# Patient Record
Sex: Female | Born: 1990 | Race: White | Hispanic: No | Marital: Single | Smoking: Former smoker
Health system: Southern US, Community
[De-identification: ages and names within clinical notes are randomized; demographics above are authoritative.]

## PROBLEM LIST (undated history)

## (undated) DIAGNOSIS — F319 Bipolar disorder, unspecified: Secondary | ICD-10-CM

## (undated) DIAGNOSIS — F4310 Post-traumatic stress disorder, unspecified: Secondary | ICD-10-CM

## (undated) DIAGNOSIS — F1411 Cocaine abuse, in remission: Secondary | ICD-10-CM

## (undated) DIAGNOSIS — F419 Anxiety disorder, unspecified: Secondary | ICD-10-CM

## (undated) DIAGNOSIS — F1990 Other psychoactive substance use, unspecified, uncomplicated: Secondary | ICD-10-CM

## (undated) HISTORY — PX: HERNIA REPAIR: SHX51

## (undated) HISTORY — PX: OVARIAN CYST REMOVAL: SHX89

---

## 2017-04-20 ENCOUNTER — Ambulatory Visit
Admission: EM | Admit: 2017-04-20 | Discharge: 2017-04-20 | Disposition: A | Discharge status: Home / Self Care | Payer: Medicaid Other | Attending: Emergency Medicine | Admitting: Emergency Medicine

## 2017-04-20 ENCOUNTER — Other Ambulatory Visit: Payer: Self-pay

## 2017-04-20 DIAGNOSIS — F329 Major depressive disorder, single episode, unspecified: Secondary | ICD-10-CM | POA: Diagnosis not present

## 2017-04-20 DIAGNOSIS — F32A Depression, unspecified: Secondary | ICD-10-CM

## 2017-04-20 DIAGNOSIS — Z760 Encounter for issue of repeat prescription: Secondary | ICD-10-CM | POA: Diagnosis not present

## 2017-04-20 DIAGNOSIS — F4310 Post-traumatic stress disorder, unspecified: Secondary | ICD-10-CM

## 2017-04-20 DIAGNOSIS — F319 Bipolar disorder, unspecified: Secondary | ICD-10-CM | POA: Diagnosis not present

## 2017-04-20 HISTORY — DX: Post-traumatic stress disorder, unspecified: F43.10

## 2017-04-20 HISTORY — DX: Bipolar disorder, unspecified: F31.9

## 2017-04-20 HISTORY — DX: Anxiety disorder, unspecified: F41.9

## 2017-04-20 MED ORDER — FLUOXETINE HCL 40 MG PO CAPS: 80.0000 mg | ORAL_CAPSULE | Freq: Every day | ORAL | 0 refills | Status: DC

## 2017-04-20 MED ORDER — ALPRAZOLAM 1 MG PO TABS: 1.0000 mg | ORAL_TABLET | Freq: Three times a day (TID) | ORAL | 0 refills | Status: DC | PRN

## 2017-04-20 MED ORDER — LURASIDONE HCL 60 MG PO TABS: 60.0000 mg | ORAL_TABLET | Freq: Every day | ORAL | 0 refills | Status: DC

## 2017-04-20 NOTE — Discharge Instructions (Signed)
Follow[facility] [place] as [place].  Go immediately to the ER for thoughts of hurting yourself other people, if your depression gets worse, for auditory visual hallucinations, or for any concerns.

## 2017-04-20 NOTE — ED Provider Notes (Signed)
HPI  SUBJECTIVE:  Patrizia Cuff is a 27 y.o. female who presents for medication refill.  States that she ran out of her Latuda, Prozac and Xanax about a week and a half ago.  States that she has plenty of metformin and prazosin available.  She has an appointment with [facility] on March 4, and 11 days.  She was getting her prescriptions from [facility] in [place].  She denies suicidal, homicidal ideation, auditory or visual hallucinations.  She states that she has tried Vistaril in the past for her anxiety/PTSD and it has not worked for her.  The Prozac, Latuda and Xanax worked well for her.  She has a past medical history of bipolar 1, PTSD, anxiety, depression.  No history of diabetes.  LMP: Nexplanon.  Denies the possibility being pregnant.  PMD. None currently   Past Medical History:  Diagnosis Date  . Anxiety   . Bipolar 1 disorder (HCC)   . PTSD (post-traumatic stress disorder)     History reviewed. No pertinent surgical history.  History reviewed. No pertinent family history.  Social History   Tobacco Use  . Smoking status: Former Smoker    Types: E-cigarettes  . Smokeless tobacco: Current User  Substance Use Topics  . Alcohol use: No    Frequency: Never  . Drug use: No    No current facility-administered medications for this encounter.   Current Outpatient Medications:  .  metFORMIN (GLUCOPHAGE) 500 MG tablet, Take 500 mg by mouth 2 (two) times daily with a meal., Disp: , Rfl:  .  ALPRAZolam (XANAX) 1 MG tablet, Take 1 tablet (1 mg total) by mouth 3 (three) times daily as needed for anxiety., Disp: 33 tablet, Rfl: 0 .  FLUoxetine (PROZAC) 40 MG capsule, Take 2 capsules (80 mg total) by mouth daily for 12 days., Disp: 24 capsule, Rfl: 0 .  lurasidone 60 MG TABS, Take 1 tablet (60 mg total) by mouth daily with breakfast for 12 days., Disp: 12 tablet, Rfl: 0  Allergies  Allergen Reactions  . Doxycycline Rash     ROS  As noted in HPI.    Physical Exam  BP 126/78 (BP Location: Left Arm)   Pulse (!) 109   Temp 98.2 F (36.8 C) (Oral)   Resp 18   Wt 172 lb (78 kg)   SpO2 100%   Constitutional: Well developed, well nourished, no acute distress Eyes:  EOMI, conjunctiva normal bilaterally HENT: Normocephalic, atraumatic,mucus membranes moist Respiratory: Normal inspiratory effort Cardiovascular: Normal rate GI: nondistended skin: No rash, skin intact Musculoskeletal: no deformities Neurologic: Alert & oriented x 3, no focal neuro deficits Psychiatric: Speech and behavior appropriate.    ED Course   Medications - No data to display  No orders of the defined types were placed in this encounter.   No results found for this or any previous visit (from the past 24 hour(s)). No results found.  ED Clinical Impression  Medication refill  PTSD (post-traumatic stress disorder)  Depression, unspecified depression type  Bipolar affective disorder, remission status unspecified (HCC)   ED Assessment/Plan  [place] Narcotic database reviewed for this patient, and feel that the risk/benefit ratio today is favorable for proceeding with a prescription for controlled substance.  No benzodiazepine prescriptions in [place], [place], [place] for the past 2 years.  Will refill patient's Prozac 80 mg daily, Latuda 60 mg daily and will do 11 days of Xanax 1 mg 3 times a day.  She  will follow-up [facility] as scheduled on March 4.  She will go go to the ER for any suicidal, homicidal ideation, auditory visual or other concerns.  Patient agrees with plan.   Meds ordered this encounter  Medications  . ALPRAZolam (XANAX) 1 MG tablet    Sig: Take 1 tablet (1 mg total) by mouth 3 (three) times daily as needed for anxiety.    Dispense:  33 tablet    Refill:  0  . FLUoxetine (PROZAC) 40 MG capsule    Sig: Take 2 capsules (80 mg total) by mouth daily for 12 days.     Dispense:  24 capsule    Refill:  0  . lurasidone 60 MG TABS    Sig: Take 1 tablet (60 mg total) by mouth daily with breakfast for 12 days.    Dispense:  12 tablet    Refill:  0    *This clinic note was created using Scientist, clinical (histocompatibility and immunogenetics)Dragon dictation software. Therefore, there may be occasional mistakes despite careful proofreading.   ?   Domenick GongMortenson, Katrianna Friesenhahn, MD 04/20/17 [phone]

## 2017-04-20 NOTE — ED Triage Notes (Signed)
Pt is out of her medications to manage, PTSD, Bipolar, and anxiety. She has an appt however it is March 3. She has been out for a week and a half.

## 2017-07-25 ENCOUNTER — Ambulatory Visit
Admission: EM | Admit: 2017-07-25 | Discharge: 2017-07-25 | Disposition: A | Discharge status: Home / Self Care | Payer: Medicaid Other | Attending: Family Medicine | Admitting: Family Medicine

## 2017-07-25 ENCOUNTER — Encounter: Payer: Self-pay | Admitting: Emergency Medicine

## 2017-07-25 ENCOUNTER — Other Ambulatory Visit: Payer: Self-pay

## 2017-07-25 DIAGNOSIS — K0889 Other specified disorders of teeth and supporting structures: Secondary | ICD-10-CM

## 2017-07-25 MED ORDER — AMOXICILLIN-POT CLAVULANATE 875-125 MG PO TABS: 1.0000 | ORAL_TABLET | Freq: Two times a day (BID) | ORAL | 0 refills | Status: DC

## 2017-07-25 MED ORDER — IBUPROFEN 800 MG PO TABS: 800.0000 mg | ORAL_TABLET | Freq: Three times a day (TID) | ORAL | 0 refills | Status: DC | PRN

## 2017-07-25 MED ORDER — LIDOCAINE VISCOUS HCL 2 % MT SOLN: 5.0000 mL | Freq: Four times a day (QID) | OROMUCOSAL | 0 refills | Status: DC | PRN

## 2017-07-25 MED ORDER — LIDOCAINE VISCOUS HCL 2 % MT SOLN: 15.0000 mL | Freq: Once | OROMUCOSAL | Status: DC

## 2017-07-25 MED ORDER — LIDOCAINE VISCOUS HCL 2 % MT SOLN
5.0000 mL | Freq: Once | OROMUCOSAL | Status: AC
  Administered 2017-07-25: 5 mL via OROMUCOSAL

## 2017-07-25 NOTE — ED Provider Notes (Signed)
[facility] ____________________________________________  Time seen: Approximately 7:31 PM  I have reviewed the triage vital signs and the nursing notes.   HISTORY  Chief Complaint Dental Pain  HPI Virginia Lawson is a 27 y.o. female presenting for evaluation of right lower dental pain.  She has had issues with this tooth for at least several weeks, but reports it is worsened over the last 2 to 3 days.  Patient states that she was referred by her dentist to patient states that dental surgeon that could extract with anesthesia.  States that her dentist that it needs to be extracted.  States that she knows she has a chip to the back of the tooth is been there.  Denies any acute injury.  Denies recent cough, congestion, sore throat, fevers.  Has continued to overall eat and drink well.  States pain has been continuously worsening and disrupting daily activities.  States tried some topical numbing medicine over-the-counter which did not help.  No other alleviating measures attempted.  Denies other aggravating factors.  Reports otherwise feels well.  No LMP recorded (lmp unknown). Patient has had an implant.  Denies pregnancy.  Past Medical History:  Diagnosis Date  . Anxiety   . Bipolar 1 disorder (HCC)   . PTSD (post-traumatic stress disorder)     There are no active problems to display for this patient.   Past Surgical History:  Procedure Laterality Date  . OVARIAN CYST REMOVAL      Current Outpatient Rx  . Order #: [id number] Class: Historical Med  . Order #: [id number] Class: Historical Med  . Order #: [id number] Class: Historical Med  . Order #: [id number] Class: Historical Med  . Order #: [id number] Class: Historical Med  . Order #: [id number] Class: Normal  . Order #: [id number] Class: Normal  . Order #: [id number] Class: Normal    Allergies Doxycycline and Tramadol  History reviewed. No pertinent family history.  Social History Social History   Tobacco Use  . Smoking  status: Current Every Day Smoker    Packs/day: 1.00    Types: Cigarettes  . Smokeless tobacco: Never Used  Substance Use Topics  . Alcohol use: No    Frequency: Never  . Drug use: No    Review of Systems Constitutional: No fever/chills. Reports continues to eat and drink foods and fluids well.  ENT: No sore throat.  As above Cardiovascular: Denies chest pain. Respiratory: Denies shortness of breath. Gastrointestinal: No abdominal pain.  No nausea, no vomiting.  Skin: Negative for rash. ____________________________________________   PHYSICAL EXAM:  VITAL SIGNS: ED Triage Vitals  Enc Vitals Group     BP 07/25/17 1847 113/77     Pulse Rate 07/25/17 1847 77     Resp 07/25/17 1847 18     Temp 07/25/17 1847 98.5 F (36.9 C)     Temp Source 07/25/17 1847 Oral     SpO2 07/25/17 1847 98 %     Weight 07/25/17 1839 170 lb (77.1 kg)     Height 07/25/17 1839  (1.676 m)     Head Circumference --      Peak Flow --      Pain Score 07/25/17 1839 9     Pain Loc --      Pain Edu? --      Excl. in GC? --     Constitutional: Alert and oriented. Well appearing and in no acute distress. Eyes: Conjunctivae are normal.  Head: Atraumatic.  No facial swelling noted. Ears: Bilateral ears no erythema, normal  TMs.  Nose: No congestion/rhinnorhea. Mouth/Throat: Mucous membranes are moist.  Oropharynx non-erythematous.  No tonsillar swelling exudate. Periodontal Exam     Gumline tenderness, erythema and edema present as above, no palpated abscess, no fluctuance, moderate tenderness to direct palpation of tooth 29 as well as surrounding gum. Neck: No stridor.  Hematological/Lymphatic/Immunilogical: No cervical lymphadenopathy. Cardiovascular:   Normal rate, regular rhythm. Grossly normal heart sounds. Good peripheral circulation. Respiratory: Normal respiratory effort.  No retractions.  No wheezes, rales or rhonchi. Musculoskeletal: Steady gait. Neurologic:  Normal speech and language.  No gross focal neurologic deficits are appreciated. Speech is normal. No gait instability. Skin:  Skin is warm, dry and intact. No rash noted. Psychiatric: Mood and affect are normal. Speech and behavior are normal.  ____________________________________________   LABS (all labs ordered are listed, but only abnormal results are displayed)  Labs Reviewed - No data to display   PROCEDURES  INITIAL IMPRESSION / ASSESSMENT AND PLAN / ED COURSE  Pertinent labs & imaging results that were available during my care of the patient were reviewed by me and considered in my medical decision making (see chart for details). Well-appearing patient.  No acute distress.  Presenting for evaluation of dental pain as above.  Suspect acute infection, no dental abscess visualized or palpated.  Will empirically start on oral Augmentin.  Start on 800 mg ibuprofen and PRN viscous lidocaine.  Encourage follow-up with dentist as soon as possible.  Discussed her follow-up and return parameters.   Instructed to return to the Urgent Care or ER  for symptoms that change or worsen or if unable to schedule an appointment. ____________________________________________   FINAL CLINICAL IMPRESSION(S) / ED DIAGNOSES  Final diagnoses:  Pain, dental         Renford Dills, NP 07/25/17 1938

## 2017-07-25 NOTE — Discharge Instructions (Signed)
Take medication as prescribed. Rest. Drink plenty of fluids.   Follow up with Dentist as soon as possible.   Follow up with your primary care physician this week as needed. Return to Urgent care for new or worsening concerns.

## 2017-07-25 NOTE — ED Triage Notes (Signed)
Patient c/o right lower tooth pain that started 5 days ago. Patient stated she has an exposed nerve that is causing the pain. States her right ear and head are hurting from the tooth pain.

## 2018-01-20 ENCOUNTER — Ambulatory Visit
Admission: EM | Admit: 2018-01-20 | Discharge: 2018-01-20 | Disposition: A | Discharge status: Home / Self Care | Payer: Medicaid Other | Attending: Family Medicine | Admitting: Family Medicine

## 2018-01-20 ENCOUNTER — Other Ambulatory Visit: Payer: Self-pay

## 2018-01-20 ENCOUNTER — Encounter: Payer: Self-pay | Admitting: Emergency Medicine

## 2018-01-20 DIAGNOSIS — K047 Periapical abscess without sinus: Secondary | ICD-10-CM

## 2018-01-20 MED ORDER — HYDROCODONE-ACETAMINOPHEN 5-325 MG PO TABS: ORAL_TABLET | ORAL | 0 refills | Status: DC

## 2018-01-20 MED ORDER — PENICILLIN V POTASSIUM 500 MG PO TABS: 500.0000 mg | ORAL_TABLET | Freq: Three times a day (TID) | ORAL | 0 refills | Status: DC

## 2018-01-20 NOTE — ED Triage Notes (Signed)
Patient c/o pain and swelling in the bottom left tooth that started 6 weeks ago.  Patient reports some pus.

## 2018-01-20 NOTE — ED Provider Notes (Signed)
[facility]    CSN: [id number] Arrival date & time: 01/20/18  1615     History   Chief Complaint Chief Complaint  Patient presents with  . Dental Pain    HPI Virginia Lawson is a 27 y.o. female.   The history is provided by the patient.  Dental Pain  Location:  Lower Lower teeth location:  19/LL 1st molar Quality:  Aching Severity:  Moderate Onset quality:  Gradual Duration:  6 weeks Timing:  Constant Progression:  Worsening Chronicity:  New Context: dental caries and poor dentition   Relieved by:  Nothing Ineffective treatments:  NSAIDs, acetaminophen and topical anesthetic gel Associated symptoms: gum swelling   Associated symptoms: no congestion, no difficulty swallowing, no drooling, no facial pain, no facial swelling, no fever, no headaches, no neck pain, no neck swelling, no oral bleeding, no oral lesions and no trismus   Risk factors: lack of dental care, periodontal disease and smoking     Past Medical History:  Diagnosis Date  . Anxiety   . Bipolar 1 disorder (HCC)   . PTSD (post-traumatic stress disorder)     There are no active problems to display for this patient.   Past Surgical History:  Procedure Laterality Date  . HERNIA REPAIR    . OVARIAN CYST REMOVAL      OB History   None      Home Medications    Prior to Admission medications   Medication Sig Start Date End Date Taking? Authorizing Provider  atorvastatin (LIPITOR) 20 MG tablet Take 20 mg by mouth daily.   Yes [provider]  desvenlafaxine (PRISTIQ) 50 MG 24 hr tablet Take 50 mg by mouth daily.   Yes [provider]  gabapentin (NEURONTIN) 300 MG capsule Take 600 mg by mouth 3 (three) times daily.   Yes [provider]  metFORMIN (GLUCOPHAGE) 500 MG tablet Take 500 mg by mouth 2 (two) times daily with a meal.   Yes [provider]  prazosin (MINIPRESS) 1 MG capsule  07/08/17  Yes [provider]  amoxicillin-clavulanate  (AUGMENTIN) 875-125 MG tablet Take 1 tablet by mouth every 12 (twelve) hours. 07/25/17   Renford DillsMiller, Lindsey, NP  HYDROcodone-acetaminophen (NORCO/VICODIN) 5-325 MG tablet 1-2 tabs po qd prn 01/20/18   Payton Mccallumonty, Melik Blancett, MD  ibuprofen (ADVIL,MOTRIN) 800 MG tablet Take 1 tablet (800 mg total) by mouth every 8 (eight) hours as needed for moderate pain. 07/25/17   Renford DillsMiller, Lindsey, NP  lidocaine (XYLOCAINE) 2 % solution Use as directed 5 mLs in the mouth or throat every 6 (six) hours as needed for mouth pain (hold to area x 3 mins then spit). 07/25/17   Renford DillsMiller, Lindsey, NP  penicillin v potassium (VEETID) 500 MG tablet Take 1 tablet (500 mg total) by mouth 3 (three) times daily. 01/20/18   Payton Mccallumonty, Thaniel Coluccio, MD    Family History Family History  Family history unknown: Yes    Social History Social History   Tobacco Use  . Smoking status: Current Every Day Smoker    Packs/day: 1.00    Types: Cigarettes  . Smokeless tobacco: Never Used  Substance Use Topics  . Alcohol use: No    Frequency: Never  . Drug use: No     Allergies   Doxycycline and Tramadol   Review of Systems Review of Systems  Constitutional: Negative for fever.  HENT: Negative for congestion, drooling, facial swelling and mouth sores.   Musculoskeletal: Negative for neck pain.  Neurological: Negative for  headaches.     Physical Exam Triage Vital Signs ED Triage Vitals  Enc Vitals Group     BP 01/20/18 1631 120/82     Pulse Rate 01/20/18 1631 95     Resp 01/20/18 1631 16     Temp 01/20/18 1631 98.5 F (36.9 C)     Temp Source 01/20/18 1631 Oral     SpO2 01/20/18 1631 97 %     Weight 01/20/18 1628 206 lb (93.4 kg)     Height 01/20/18 1628 5\' 5"  (1.651 m)     Head Circumference --      Peak Flow --      Pain Score 01/20/18 1628 7     Pain Loc --      Pain Edu? --      Excl. in GC? --    No data found.  Updated Vital Signs BP 120/82 (BP Location: Left Arm)   Pulse 95   Temp 98.5 F (36.9 C) (Oral)   Resp 16    Ht 5\' 5"  (1.651 m)   Wt 93.4 kg   SpO2 97%   BMI 34.28 kg/m   Visual Acuity Right Eye Distance:   Left Eye Distance:   Bilateral Distance:    Right Eye Near:   Left Eye Near:    Bilateral Near:     Physical Exam  Constitutional: She appears well-developed and well-nourished. No distress.  HENT:  Mouth/Throat: Uvula is midline, oropharynx is clear and moist and mucous membranes are normal. Abnormal dentition. Dental abscesses present. No uvula swelling. No tonsillar exudate.  Skin: She is not diaphoretic.  Nursing note and vitals reviewed.    UC Treatments / Results  Labs (all labs ordered are listed, but only abnormal results are displayed) Labs Reviewed - No data to display  EKG None  Radiology No results found.  Procedures Procedures (including critical care time)  Medications Ordered in UC Medications - No data to display  Initial Impression / Assessment and Plan / UC Course  I have reviewed the triage vital signs and the nursing notes.  Pertinent labs & imaging results that were available during my care of the patient were reviewed by me and considered in my medical decision making (see chart for details).      Final Clinical Impressions(s) / UC Diagnoses   Final diagnoses:  Dental infection    ED Prescriptions    Medication Sig Dispense Auth. Provider   penicillin v potassium (VEETID) 500 MG tablet Take 1 tablet (500 mg total) by mouth 3 (three) times daily. 30 tablet [name], MD   HYDROcodone-acetaminophen (NORCO/VICODIN) 5-325 MG tablet  (Status: Discontinued) 1-2 tabs po qd prn 4 tablet [name], MD   HYDROcodone-acetaminophen (NORCO/VICODIN) 5-325 MG tablet 1-2 tabs po qd prn 4 tablet [name], MD     1.diagnosis reviewed with patient 2. rx as per orders above; reviewed possible side effects, interactions, risks and benefits  3. Recommend supportive treatment with otc analgesics prn 4. Follow-up prn if symptoms worsen or  don't improve   Controlled Substance Prescriptions [place] Controlled Substance Registry consulted? Not Applicable   [name], MD 01/20/18 [phone]

## 2018-03-03 ENCOUNTER — Other Ambulatory Visit: Payer: Self-pay

## 2018-03-03 ENCOUNTER — Ambulatory Visit
Admission: EM | Admit: 2018-03-03 | Discharge: 2018-03-03 | Disposition: A | Discharge status: Home / Self Care | Payer: Medicaid Other | Attending: Family Medicine | Admitting: Family Medicine

## 2018-03-03 DIAGNOSIS — K089 Disorder of teeth and supporting structures, unspecified: Secondary | ICD-10-CM | POA: Diagnosis not present

## 2018-03-03 DIAGNOSIS — K0889 Other specified disorders of teeth and supporting structures: Secondary | ICD-10-CM | POA: Insufficient documentation

## 2018-03-03 MED ORDER — HYDROCODONE-ACETAMINOPHEN 5-325 MG PO TABS: 1.0000 | ORAL_TABLET | Freq: Three times a day (TID) | ORAL | 0 refills | Status: DC | PRN

## 2018-03-03 MED ORDER — CLINDAMYCIN HCL 150 MG PO CAPS: 450.0000 mg | ORAL_CAPSULE | Freq: Three times a day (TID) | ORAL | 0 refills | Status: AC

## 2018-03-03 NOTE — ED Provider Notes (Signed)
[facility]    CSN: [id number] Arrival date & time: 03/03/18  1928  History   Chief Complaint Chief Complaint  Patient presents with  . Dental Pain   HPI   28 year old female presents with dental pain.  Patient has had an ongoing dental infection/pain for months.  Patient has been treated with antibiotics without resolution.  She states that she cannot get into the dentist until February.  She states that she has caused coming out of her gum/around her tooth (left lower).  Patient ports associated pain.  Moderate to severe.  Patient also has a broken tooth on the right side.  Poor dentition.  Smokes.  Reports subjective fever.  No other complaints or concerns at this time.   PMH, Surgical Hx, Family Hx, Social History reviewed and updated as below.  Past Medical History:  Diagnosis Date  . Anxiety   . Bipolar 1 disorder (HCC)   . PTSD (post-traumatic stress disorder)    Past Surgical History:  Procedure Laterality Date  . HERNIA REPAIR    . OVARIAN CYST REMOVAL     OB History   No obstetric history on file.    Home Medications    Prior to Admission medications   Medication Sig Start Date End Date Taking? Authorizing Provider  atorvastatin (LIPITOR) 20 MG tablet Take 20 mg by mouth daily.   Yes [provider]  desvenlafaxine (PRISTIQ) 50 MG 24 hr tablet Take 50 mg by mouth daily.   Yes [provider]  gabapentin (NEURONTIN) 300 MG capsule Take 600 mg by mouth 3 (three) times daily.   Yes [provider]  lidocaine (XYLOCAINE) 2 % solution Use as directed 5 mLs in the mouth or throat every 6 (six) hours as needed for mouth pain (hold to area x 3 mins then spit). 07/25/17  Yes Renford Dills, NP  metFORMIN (GLUCOPHAGE) 500 MG tablet Take 500 mg by mouth 2 (two) times daily with a meal.   Yes [provider]  prazosin (MINIPRESS) 1 MG capsule  07/08/17  Yes [provider]  clindamycin (CLEOCIN) 150 MG capsule Take 3  capsules (450 mg total) by mouth 3 (three) times daily for 14 days. 03/03/18 03/17/18  Tommie Sams, DO  HYDROcodone-acetaminophen (NORCO/VICODIN) 5-325 MG tablet Take 1 tablet by mouth every 8 (eight) hours as needed. 03/03/18   Tommie Sams, DO    Family History Family History  Family history unknown: Yes    Social History Social History   Tobacco Use  . Smoking status: Current Every Day Smoker    Packs/day: 1.00    Types: Cigarettes  . Smokeless tobacco: Never Used  Substance Use Topics  . Alcohol use: No    Frequency: Never  . Drug use: No     Allergies   Doxycycline and Tramadol   Review of Systems Review of Systems  Constitutional: Positive for fever.  HENT: Positive for dental problem.    Physical Exam Triage Vital Signs ED Triage Vitals  Enc Vitals Group     BP 03/03/18 1942 122/89     Pulse Rate 03/03/18 1942 (!) 122     Resp 03/03/18 1942 18     Temp 03/03/18 1942 99.1 F (37.3 C)     Temp Source 03/03/18 1942 Oral     SpO2 03/03/18 1942 97 %     Weight 03/03/18 1940 206 lb (93.4 kg)     Height 03/03/18 1940 5\' 5"  (1.651 m)  Head Circumference --      Peak Flow --      Pain Score 03/03/18 1940 7     Pain Loc --      Pain Edu? --      Excl. in GC? --    Updated Vital Signs BP 122/89 (BP Location: Left Arm)   Pulse (!) 122   Temp 99.1 F (37.3 C) (Oral)   Resp 18   Ht 5\' 5"  (1.651 m)   Wt 93.4 kg   SpO2 97%   BMI 34.28 kg/m   Visual Acuity Right Eye Distance:   Left Eye Distance:   Bilateral Distance:    Right Eye Near:   Left Eye Near:    Bilateral Near:     Physical Exam Vitals signs and nursing note reviewed.  Constitutional:      General: She is not in acute distress. HENT:     Head: Normocephalic and atraumatic.     Mouth/Throat:      Comments: Labelled tooth is actually missing.  Patient has area of suspected abscess/pus labeled location. Cardiovascular:     Rate and Rhythm: Regular rhythm. Tachycardia present.    Pulmonary:     Effort: Pulmonary effort is normal. No respiratory distress.  Neurological:     Mental Status: She is alert.  Psychiatric:        Mood and Affect: Mood normal.    UC Treatments / Results  Labs (all labs ordered are listed, but only abnormal results are displayed) Labs Reviewed - No data to display  EKG None  Radiology No results found.  Procedures Procedures (including critical care time)  Medications Ordered in UC Medications - No data to display  Initial Impression / Assessment and Plan / UC Course  I have reviewed the triage vital signs and the nursing notes.  Pertinent labs & imaging results that were available during my care of the patient were reviewed by me and considered in my medical decision making (see chart for details).    28 year old female presents with dental infection.  Treating with clindamycin.  Vicodin for pain.  [place] controlled substance database reviewed.  No concerns for abuse at this time.  Final Clinical Impressions(s) / UC Diagnoses   Final diagnoses:  Pain, dental  Poor dentition     Discharge Instructions     Call the dentist or try another dental office.  Medications as prescribed.  Dr. Adriana Simas    ED Prescriptions    Medication Sig Dispense Auth. Provider   clindamycin (CLEOCIN) 150 MG capsule Take 3 capsules (450 mg total) by mouth 3 (three) times daily for 14 days. 126 capsule Sakina Briones G, DO   HYDROcodone-acetaminophen (NORCO/VICODIN) 5-325 MG tablet Take 1 tablet by mouth every 8 (eight) hours as needed. 10 tablet Tommie Sams, DO     Controlled Substance Prescriptions [place] Controlled Substance Registry consulted? Not Applicable   Tommie Sams, DO 03/03/18 2109

## 2018-03-03 NOTE — ED Triage Notes (Signed)
Patient complains of dental abscess on left lower jaw. Patient states that she cant see the dentist til Feb. Patient states that area has pus coming out of tooth and has been on one antibiotic already for this. States that it did not help. Total time has been constant x 2 months.

## 2018-03-03 NOTE — Discharge Instructions (Signed)
Call the dentist or try another dental office.  Medications as prescribed.  Dr. Adriana Simas

## 2018-04-19 ENCOUNTER — Other Ambulatory Visit: Payer: Self-pay

## 2018-04-19 ENCOUNTER — Encounter: Payer: Self-pay | Admitting: Emergency Medicine

## 2018-04-19 ENCOUNTER — Ambulatory Visit
Admission: EM | Admit: 2018-04-19 | Discharge: 2018-04-19 | Disposition: A | Discharge status: Home / Self Care | Payer: Medicaid Other | Attending: Family Medicine | Admitting: Family Medicine

## 2018-04-19 DIAGNOSIS — K0889 Other specified disorders of teeth and supporting structures: Secondary | ICD-10-CM | POA: Diagnosis not present

## 2018-04-19 DIAGNOSIS — Z720 Tobacco use: Secondary | ICD-10-CM

## 2018-04-19 MED ORDER — CLINDAMYCIN HCL 150 MG PO CAPS: 450.0000 mg | ORAL_CAPSULE | Freq: Three times a day (TID) | ORAL | 0 refills | Status: AC

## 2018-04-19 MED ORDER — KETOROLAC TROMETHAMINE 10 MG PO TABS: 10.0000 mg | ORAL_TABLET | Freq: Four times a day (QID) | ORAL | 0 refills | Status: DC | PRN

## 2018-04-19 NOTE — ED Provider Notes (Signed)
[facility]    CSN: [id number] Arrival date & time: 04/19/18  1813  History   Chief Complaint Chief Complaint  Patient presents with  . Dental Pain    HPI  28 year old female presents with dental pain.  This has been an ongoing complaint for her for the past several months.  He has been seen here 3 times for the same complaint.  Most recently seen by me in January.  Patient complaining of continued dental pain.  Patient reports that she believes that she has an abscess.  Patient also states that she has a cracked tooth on the right side.  Patient complaining of severe pain.  Patient requesting antibiotic therapy as well as pain medication while she awaits seeing a dentist.  She states that she has an appointment next week.  Past Medical History:  Diagnosis Date  . Anxiety   . Bipolar 1 disorder (HCC)   . PTSD (post-traumatic stress disorder)    Past Surgical History:  Procedure Laterality Date  . HERNIA REPAIR    . OVARIAN CYST REMOVAL      OB History   No obstetric history on file.    Home Medications    Prior to Admission medications   Medication Sig Start Date End Date Taking? Authorizing Provider  atorvastatin (LIPITOR) 20 MG tablet Take 20 mg by mouth daily.   Yes [provider]  desvenlafaxine (PRISTIQ) 50 MG 24 hr tablet Take 50 mg by mouth daily.   Yes [provider]  gabapentin (NEURONTIN) 300 MG capsule Take 600 mg by mouth 3 (three) times daily.   Yes [provider]  lidocaine (XYLOCAINE) 2 % solution Use as directed 5 mLs in the mouth or throat every 6 (six) hours as needed for mouth pain (hold to area x 3 mins then spit). 07/25/17  Yes Renford DillsMiller, Lindsey, NP  metFORMIN (GLUCOPHAGE) 500 MG tablet Take 500 mg by mouth 2 (two) times daily with a meal.   Yes [provider]  prazosin (MINIPRESS) 1 MG capsule  07/08/17  Yes [provider]  clindamycin (CLEOCIN) 150 MG capsule Take 3 capsules (450 mg total) by  mouth 3 (three) times daily for 7 days. 04/19/18 04/26/18  Tommie Samsook, [place] Teters G, DO  ketorolac (TORADOL) 10 MG tablet Take 1 tablet (10 mg total) by mouth every 6 (six) hours as needed for moderate pain or severe pain. 04/19/18   Tommie Samsook, Knox Holdman G, DO    Family History Family History  Family history unknown: Yes    Social History Social History   Tobacco Use  . Smoking status: Current Every Day Smoker    Packs/day: 1.00    Types: Cigarettes  . Smokeless tobacco: Never Used  Substance Use Topics  . Alcohol use: No    Frequency: Never  . Drug use: No     Allergies   Doxycycline and Tramadol   Review of Systems Review of Systems  Constitutional: Negative.   HENT: Positive for dental problem.    Physical Exam Triage Vital Signs ED Triage Vitals  Enc Vitals Group     BP 04/19/18 1911 128/83     Pulse Rate 04/19/18 1911 96     Resp 04/19/18 1911 18     Temp 04/19/18 1910 98.3 F (36.8 C)     Temp Source 04/19/18 1910 Oral     SpO2 04/19/18 1911 98 %     Weight 04/19/18 1911 205 lb (93 kg)     Height 04/19/18 1911  5\' 6"  (1.676 m)     Head Circumference --      Peak Flow --      Pain Score 04/19/18 1911 8     Pain Loc --      Pain Edu? --      Excl. in GC? --    Updated Vital Signs BP 128/83 (BP Location: Left Arm)   Pulse 96   Temp 98.3 F (36.8 C) (Oral)   Resp 18   Ht 5\' 6"  (1.676 m)   Wt 93 kg   SpO2 98%   BMI 33.09 kg/m   Visual Acuity Right Eye Distance:   Left Eye Distance:   Bilateral Distance:    Right Eye Near:   Left Eye Near:    Bilateral Near:     Physical Exam Vitals signs and nursing note reviewed.  Constitutional:      General: She is not in acute distress.    Appearance: Normal appearance.  HENT:     Head: Normocephalic and atraumatic.     Nose: Nose normal.     Mouth/Throat:      Comments: Patient reporting drainage from this area.  Patient actually has no tooth at this location.  No evidence of purulence.  Skin does appear to be  irritated.  Tooth that is labeled and balloon is cracked.  Eyes:     General:        Right eye: No discharge.        Left eye: No discharge.     Conjunctiva/sclera: Conjunctivae normal.  Cardiovascular:     Rate and Rhythm: Normal rate and regular rhythm.  Pulmonary:     Effort: Pulmonary effort is normal.     Breath sounds: Normal breath sounds.  Neurological:     Mental Status: She is alert.  Psychiatric:        Mood and Affect: Mood normal.        Behavior: Behavior normal.      UC Treatments / Results  Labs (all labs ordered are listed, but only abnormal results are displayed) Labs Reviewed - No data to display  EKG None  Radiology No results found.  Procedures Procedures (including critical care time)  Medications Ordered in UC Medications - No data to display  Initial Impression / Assessment and Plan / UC Course  I have reviewed the triage vital signs and the nursing notes.  Pertinent labs & imaging results that were available during my care of the patient were reviewed by me and considered in my medical decision making (see chart for details).    28 year old female presents with dental pain.  Placing on clindamycin to cover for potential infection.  Ketorolac given for pain.  I am not prescribing opiates as patient has prior history of opiate abuse and has been seen here for the same complaint 3 times in the past few months.  Final Clinical Impressions(s) / UC Diagnoses   Final diagnoses:  Pain, dental     Discharge Instructions     Medications as prescribed.  Please see the dentist.  Take care  Dr. Adriana Simas    ED Prescriptions    Medication Sig Dispense Auth. Provider   ketorolac (TORADOL) 10 MG tablet Take 1 tablet (10 mg total) by mouth every 6 (six) hours as needed for moderate pain or severe pain. 20 tablet Yaacov Koziol G, DO   clindamycin (CLEOCIN) 150 MG capsule Take 3 capsules (450 mg total) by mouth 3 (three) times daily for  7 days. 63  capsule Tommie Sams, DO     Controlled Substance Prescriptions [place] Controlled Substance Registry consulted? Yes.    Tommie Sams, DO 04/19/18 2050

## 2018-04-19 NOTE — Discharge Instructions (Signed)
Medications as prescribed.  Please see the dentist.  Take care  Dr. Adriana Simas

## 2018-04-19 NOTE — ED Triage Notes (Signed)
Patient c/o abscess and cracked tooth on the left lower side. Patient states she is having the tooth removed in 1 week. She contacted [facility] and they advised her to come here for antibiotics as she has not been seen there before.

## 2018-05-15 ENCOUNTER — Ambulatory Visit
Admission: EM | Admit: 2018-05-15 | Discharge: 2018-05-15 | Disposition: A | Discharge status: Home / Self Care | Payer: Medicaid Other | Attending: Family Medicine | Admitting: Family Medicine

## 2018-05-15 ENCOUNTER — Encounter: Payer: Self-pay | Admitting: Emergency Medicine

## 2018-05-15 ENCOUNTER — Other Ambulatory Visit: Payer: Self-pay

## 2018-05-15 DIAGNOSIS — Z760 Encounter for issue of repeat prescription: Secondary | ICD-10-CM

## 2018-05-15 DIAGNOSIS — F329 Major depressive disorder, single episode, unspecified: Secondary | ICD-10-CM | POA: Diagnosis not present

## 2018-05-15 DIAGNOSIS — F319 Bipolar disorder, unspecified: Secondary | ICD-10-CM | POA: Diagnosis not present

## 2018-05-15 DIAGNOSIS — F4310 Post-traumatic stress disorder, unspecified: Secondary | ICD-10-CM | POA: Diagnosis not present

## 2018-05-15 MED ORDER — CLONAZEPAM 2 MG PO TABS: 2.0000 mg | ORAL_TABLET | Freq: Every day | ORAL | 0 refills | Status: AC

## 2018-05-15 MED ORDER — LURASIDONE HCL 40 MG PO TABS: 40.0000 mg | ORAL_TABLET | Freq: Every day | ORAL | 0 refills | Status: AC

## 2018-05-15 MED ORDER — PAROXETINE HCL 40 MG PO TABS: 40.0000 mg | ORAL_TABLET | ORAL | 0 refills | Status: AC

## 2018-05-15 NOTE — ED Provider Notes (Signed)
[facility]    CSN: [id number] Arrival date & time: 05/15/18  1334     History   Chief Complaint Chief Complaint  Patient presents with  . Medication Refill    HPI Virginia Lawson is a 28 y.o. female.   28 yo female here with a c/o "refill on medications". States she missed her mental health provider's appointment due to brother's death.   The history is provided by the patient.  Medication Refill  Reason for request:  Medications ran out   Past Medical History:  Diagnosis Date  . Anxiety   . Bipolar 1 disorder (HCC)   . PTSD (post-traumatic stress disorder)     There are no active problems to display for this patient.   Past Surgical History:  Procedure Laterality Date  . HERNIA REPAIR    . OVARIAN CYST REMOVAL      OB History   No obstetric history on file.      Home Medications    Prior to Admission medications   Medication Sig Start Date End Date Taking? Authorizing Provider  atorvastatin (LIPITOR) 20 MG tablet Take 20 mg by mouth daily.   Yes [provider]  gabapentin (NEURONTIN) 300 MG capsule Take 600 mg by mouth 3 (three) times daily.   Yes [provider]  metFORMIN (GLUCOPHAGE) 500 MG tablet Take 500 mg by mouth 2 (two) times daily with a meal.   Yes [provider]  prazosin (MINIPRESS) 1 MG capsule  07/08/17  Yes [provider]  clonazePAM (KLONOPIN) 2 MG tablet Take 1 tablet (2 mg total) by mouth daily. 05/15/18   [name], MD  desvenlafaxine (PRISTIQ) 50 MG 24 hr tablet Take 50 mg by mouth daily.    [provider]  ketorolac (TORADOL) 10 MG tablet Take 1 tablet (10 mg total) by mouth every 6 (six) hours as needed for moderate pain or severe pain. 04/19/18   Tommie Sams, DO  lidocaine (XYLOCAINE) 2 % solution Use as directed 5 mLs in the mouth or throat every 6 (six) hours as needed for mouth pain (hold to area x 3 mins then spit). 07/25/17   Renford Dills, NP  lurasidone (LATUDA) 40  MG TABS tablet Take 1 tablet (40 mg total) by mouth daily with breakfast. 05/15/18   [name], MD  PARoxetine (PAXIL) 40 MG tablet Take 1 tablet (40 mg total) by mouth every morning. 05/15/18   [name], MD    Family History Family History  Family history unknown: Yes    Social History Social History   Tobacco Use  . Smoking status: Current Every Day Smoker    Packs/day: 1.00    Types: Cigarettes  . Smokeless tobacco: Never Used  Substance Use Topics  . Alcohol use: No    Frequency: Never  . Drug use: No     Allergies   Doxycycline and Tramadol   Review of Systems Review of Systems   Physical Exam Triage Vital Signs ED Triage Vitals  Enc Vitals Group     BP 05/15/18 1430 123/90     Pulse Rate 05/15/18 1430 (!) 118     Resp 05/15/18 1430 18     Temp 05/15/18 1430 98.5 F (36.9 C)     Temp Source 05/15/18 1430 Oral     SpO2 05/15/18 1430 100 %     Weight 05/15/18 1428 196 lb (88.9 kg)     Height 05/15/18 1428 5\' 6"  (1.676 m)  Head Circumference --      Peak Flow --      Pain Score 05/15/18 1428 0     Pain Loc --      Pain Edu? --      Excl. in GC? --    No data found.  Updated Vital Signs BP 123/90 (BP Location: Right Arm)   Pulse (!) 118   Temp 98.5 F (36.9 C) (Oral)   Resp 18   Ht 5\' 6"  (1.676 m)   Wt 88.9 kg   SpO2 100%   BMI 31.64 kg/m   Visual Acuity Right Eye Distance:   Left Eye Distance:   Bilateral Distance:    Right Eye Near:   Left Eye Near:    Bilateral Near:     Physical Exam Vitals signs and nursing note reviewed.  Constitutional:      General: She is not in acute distress.    Appearance: Normal appearance. She is not toxic-appearing or diaphoretic.  Neurological:     Mental Status: She is alert.  Psychiatric:        Mood and Affect: Mood normal.        Behavior: Behavior normal.        Thought Content: Thought content normal.      UC Treatments / Results  Labs (all labs ordered are listed, but only  abnormal results are displayed) Labs Reviewed - No data to display  EKG None  Radiology No results found.  Procedures Procedures (including critical care time)  Medications Ordered in UC Medications - No data to display  Initial Impression / Assessment and Plan / UC Course  I have reviewed the triage vital signs and the nursing notes.  Pertinent labs & imaging results that were available during my care of the patient were reviewed by me and considered in my medical decision making (see chart for details).      Final Clinical Impressions(s) / UC Diagnoses   Final diagnoses:  Medication refill     Discharge Instructions     Follow up with your mental health provider    ED Prescriptions    Medication Sig Dispense Auth. Provider   PARoxetine (PAXIL) 40 MG tablet Take 1 tablet (40 mg total) by mouth every morning. 10 tablet [name], MD   lurasidone (LATUDA) 40 MG TABS tablet Take 1 tablet (40 mg total) by mouth daily with breakfast. 10 tablet Makya Phillis, MD   clonazePAM (KLONOPIN) 2 MG tablet Take 1 tablet (2 mg total) by mouth daily. 5 tablet [name], MD     1. diagnosis reviewed with patient 2. rx as per orders above; reviewed possible side effects, interactions, risks and benefits  3. Follow-up with mental health provider for management of her chronic mental health conditions  Controlled Substance Prescriptions [place] Controlled Substance Registry consulted? Not Applicable   [name], MD 05/15/18 [id number]

## 2018-05-15 NOTE — Discharge Instructions (Signed)
Follow-up with your mental health provider °

## 2018-05-15 NOTE — ED Triage Notes (Signed)
Patient states she needs medications on Paroxetine 40mg , Fluoxetine 20mg , Klonopin 2mg  to get her through her appointment on 03/25.  Patient missed therapy appointment on 16-May-2022 due to her brothers death. She states the appointment is rescheduled to 03/25.

## 2018-12-30 ENCOUNTER — Other Ambulatory Visit: Payer: Self-pay

## 2018-12-30 ENCOUNTER — Emergency Department
Admission: EM | Admit: 2018-12-30 | Discharge: 2018-12-30 | Disposition: A | Discharge status: Home / Self Care | Payer: Medicaid Other | Attending: Student | Admitting: Student

## 2018-12-30 ENCOUNTER — Encounter: Payer: Self-pay | Admitting: Emergency Medicine

## 2018-12-30 DIAGNOSIS — Z7984 Long term (current) use of oral hypoglycemic drugs: Secondary | ICD-10-CM | POA: Diagnosis not present

## 2018-12-30 DIAGNOSIS — F17210 Nicotine dependence, cigarettes, uncomplicated: Secondary | ICD-10-CM | POA: Insufficient documentation

## 2018-12-30 DIAGNOSIS — Z79899 Other long term (current) drug therapy: Secondary | ICD-10-CM | POA: Insufficient documentation

## 2018-12-30 DIAGNOSIS — K0889 Other specified disorders of teeth and supporting structures: Secondary | ICD-10-CM | POA: Diagnosis not present

## 2018-12-30 MED ORDER — NAPROXEN 500 MG PO TABS: 500.0000 mg | ORAL_TABLET | Freq: Two times a day (BID) | ORAL | 2 refills | Status: AC

## 2018-12-30 MED ORDER — LIDOCAINE-EPINEPHRINE 2 %-1:100000 IJ SOLN
1.7000 mL | Freq: Once | INTRAMUSCULAR | Status: AC
  Administered 2018-12-30: 1.7 mL
  Filled 2018-12-30: qty 1.7

## 2018-12-30 NOTE — ED Notes (Signed)
See triage note  Presents with dental pain  Was seen by Dentist [email or web address]  Placed on meds

## 2018-12-30 NOTE — ED Provider Notes (Signed)
[facility] Emergency Department Provider Note  ____________________________________________   First MD Initiated Contact with Patient 12/30/18 1255     (approximate)  I have reviewed the triage vital signs and the nursing notes.   HISTORY  Chief Complaint Dental Problem    HPI Virginia Lawson is a 28 y.o. female presents emergency department complaining of dental pain.  States she was started on antibiotic by her regular physician but was not given any pain medication.  States took ibuprofen for relief.  Patient is on a form of Suboxone.    Past Medical History:  Diagnosis Date  . Anxiety   . Bipolar 1 disorder (HCC)   . PTSD (post-traumatic stress disorder)     There are no active problems to display for this patient.   Past Surgical History:  Procedure Laterality Date  . HERNIA REPAIR    . OVARIAN CYST REMOVAL      Prior to Admission medications   Medication Sig Start Date End Date Taking? Authorizing Provider  atorvastatin (LIPITOR) 20 MG tablet Take 20 mg by mouth daily.    [provider]  clonazePAM (KLONOPIN) 2 MG tablet Take 1 tablet (2 mg total) by mouth daily. 05/15/18   Payton Mccallumonty, [place], MD  desvenlafaxine (PRISTIQ) 50 MG 24 hr tablet Take 50 mg by mouth daily.    [provider]  gabapentin (NEURONTIN) 300 MG capsule Take 600 mg by mouth 3 (three) times daily.    [provider]  lurasidone (LATUDA) 40 MG TABS tablet Take 1 tablet (40 mg total) by mouth daily with breakfast. 05/15/18   Payton Mccallumonty, [place], MD  metFORMIN (GLUCOPHAGE) 500 MG tablet Take 500 mg by mouth 2 (two) times daily with a meal.    [provider]  naproxen (NAPROSYN) 500 MG tablet Take 1 tablet (500 mg total) by mouth 2 (two) times daily with a meal. 12/30/18 12/30/19  Fisher, Roselyn BeringSusan W, PA-C  PARoxetine (PAXIL) 40 MG tablet Take 1 tablet (40 mg total) by mouth every morning. 05/15/18   Payton Mccallumonty, [place], MD  prazosin (MINIPRESS) 1 MG capsule   07/08/17   [provider]    Allergies Doxycycline and Tramadol  Family History  Family history unknown: Yes    Social History Social History   Tobacco Use  . Smoking status: Current Every Day Smoker    Packs/day: 1.00    Types: Cigarettes  . Smokeless tobacco: Never Used  Substance Use Topics  . Alcohol use: No    Frequency: Never  . Drug use: No    Review of Systems  Constitutional: No fever/chills Eyes: No visual changes. ENT: No sore throat.  Positive dental pain Respiratory: Denies cough Genitourinary: Negative for dysuria. Musculoskeletal: Negative for back pain. Skin: Negative for rash.    ____________________________________________   PHYSICAL EXAM:  VITAL SIGNS: ED Triage Vitals [12/30/18 1241]  Enc Vitals Group     BP 120/72     Pulse Rate (!) 107     Resp 18     Temp 98.5 F (36.9 C)     Temp Source Oral     SpO2 97 %     Weight 196 lb (88.9 kg)     Height 5\' 5"  (1.651 m)     Head Circumference      Peak Flow      Pain Score 8     Pain Loc      Pain Edu?      Excl. in GC?  Constitutional: Alert and oriented. Well appearing and in no acute distress. Eyes: Conjunctivae are normal.  Head: Atraumatic. Nose: No congestion/rhinnorhea. Mouth/Throat: Mucous membranes are moist.  Mouth with poor dentition, some redness swelling noted around the right lower incisors. Neck:  supple no lymphadenopathy noted Cardiovascular: Normal rate, regular rhythm. Heart sounds are normal Respiratory: Normal respiratory effort.  No retractions, lungs c t a  GU: deferred Musculoskeletal: FROM all extremities, warm and well perfused Neurologic:  Normal speech and language.  Skin:  Skin is warm, dry and intact. No rash noted. Psychiatric: Mood and affect are normal. Speech and behavior are normal.  ____________________________________________   LABS (all labs ordered are listed, but only abnormal results are displayed)  Labs Reviewed - No data  to display ____________________________________________   ____________________________________________  RADIOLOGY    ____________________________________________   PROCEDURES  Procedure(s) performed:   Dental Block  Date/Time: 12/30/2018 3:32 PM Performed by: Versie Starks, PA-C Authorized by: Versie Starks, PA-C   Consent:    Consent obtained:  Verbal   Consent given by:  Patient   Risks discussed:  Allergic reaction, infection, nerve damage, swelling, unsuccessful block and pain Indications:    Indications: dental pain   Procedure details (see MAR for exact dosages):    Syringe type:  Controlled syringe   Needle gauge:  30 G   Anesthetic injected:  Lidocaine 2% WITH epi   Injection procedure:  Anatomic landmarks identified, introduced needle, incremental injection, anatomic landmarks palpated and negative aspiration for blood Post-procedure details:    Outcome:  Anesthesia achieved   Patient tolerance of procedure:  Tolerated well, no immediate complications      ____________________________________________   INITIAL IMPRESSION / ASSESSMENT AND PLAN / ED COURSE  Pertinent labs & imaging results that were available during my care of the patient were reviewed by me and considered in my medical decision making (see chart for details).   Patient is 28 year old female presents emergency department with dental pain.  See HPI.  Patient's PDMP score 600.  Physical exam shows patient to appear well.  Small amount of swelling noted in the right lower jaw.  Dental block performed.  See procedure note.  Patient was given a prescription for naproxen.  She is to also continue her Augmentin.  Follow-up with her dentist.  She is discharged stable condition.    Virginia Lawson was evaluated in Emergency Department on 12/30/2018 for the symptoms described in the history of present illness. She was evaluated in the context of the global COVID-19 pandemic, which necessitated  consideration that the patient might be at risk for infection with the SARS-CoV-2 virus that causes COVID-19. Institutional protocols and algorithms that pertain to the evaluation of patients at risk for COVID-19 are in a state of rapid change based on information released by regulatory bodies including the CDC and federal and state organizations. These policies and algorithms were followed during the patient's care in the ED.   As part of my medical decision making, I reviewed the following data within the [place] notes reviewed and incorporated, Old chart reviewed, Notes from prior ED visits and [place] Controlled Substance Database  ____________________________________________   FINAL CLINICAL IMPRESSION(S) / ED DIAGNOSES  Final diagnoses:  Pain, dental      NEW MEDICATIONS STARTED DURING THIS VISIT:  New Prescriptions   NAPROXEN (NAPROSYN) 500 MG TABLET    Take 1 tablet (500 mg total) by mouth 2 (two) times daily with a meal.  Note:  This document was prepared using Dragon voice recognition software and may include unintentional dictation errors.    Faythe Ghee, PA-C 12/30/18 1535    Miguel Aschoff., MD 12/30/18 2032

## 2018-12-30 NOTE — ED Triage Notes (Signed)
Pt here for dental pain.  Saw dentist and started on abx yesterday, has root canal scheduled.  He did not give her anything for pain.  Has tried motrin without relief.  Needs something for pain.  No facial swelling noted.

## 2019-05-04 ENCOUNTER — Other Ambulatory Visit: Payer: Self-pay

## 2019-05-04 ENCOUNTER — Emergency Department: Payer: Medicaid Other

## 2019-05-04 DIAGNOSIS — Z79899 Other long term (current) drug therapy: Secondary | ICD-10-CM | POA: Insufficient documentation

## 2019-05-04 DIAGNOSIS — F17210 Nicotine dependence, cigarettes, uncomplicated: Secondary | ICD-10-CM | POA: Insufficient documentation

## 2019-05-04 DIAGNOSIS — F1410 Cocaine abuse, uncomplicated: Secondary | ICD-10-CM | POA: Insufficient documentation

## 2019-05-04 DIAGNOSIS — F1990 Other psychoactive substance use, unspecified, uncomplicated: Secondary | ICD-10-CM | POA: Insufficient documentation

## 2019-05-04 DIAGNOSIS — Z7984 Long term (current) use of oral hypoglycemic drugs: Secondary | ICD-10-CM | POA: Insufficient documentation

## 2019-05-04 LAB — URINALYSIS, COMPLETE (UACMP) WITH MICROSCOPIC
Bilirubin Urine: NEGATIVE
Glucose, UA: NEGATIVE mg/dL
Hgb urine dipstick: NEGATIVE
Ketones, ur: NEGATIVE mg/dL
Leukocytes,Ua: NEGATIVE
Nitrite: NEGATIVE
Protein, ur: 30 mg/dL — AB
Specific Gravity, Urine: 1.03 (ref 1.005–1.030)
pH: 5 (ref 5.0–8.0)

## 2019-05-04 LAB — COMPREHENSIVE METABOLIC PANEL
ALT: 34 U/L (ref 0–44)
AST: 50 U/L — ABNORMAL HIGH (ref 15–41)
Albumin: 4.2 g/dL (ref 3.5–5.0)
Alkaline Phosphatase: 113 U/L (ref 38–126)
Anion gap: 12 (ref 5–15)
BUN: 11 mg/dL (ref 6–20)
CO2: 22 mmol/L (ref 22–32)
Calcium: 9.5 mg/dL (ref 8.9–10.3)
Chloride: 100 mmol/L (ref 98–111)
Creatinine, Ser: 0.62 mg/dL (ref 0.44–1.00)
GFR calc Af Amer: >60 mL/min (ref >60)
GFR calc non Af Amer: >60 mL/min (ref >60)
Glucose, Bld: 137 mg/dL — ABNORMAL HIGH (ref 70–99)
Potassium: 3.1 mmol/L — ABNORMAL LOW (ref 3.5–5.1)
Sodium: 134 mmol/L — ABNORMAL LOW (ref 135–145)
Total Bilirubin: 0.9 mg/dL (ref 0.3–1.2)
Total Protein: 7.8 g/dL (ref 6.5–8.1)

## 2019-05-04 LAB — CBC
HCT: 34.9 % — ABNORMAL LOW (ref 36.0–46.0)
Hemoglobin: 11.2 g/dL — ABNORMAL LOW (ref 12.0–15.0)
MCH: 28.4 pg (ref 26.0–34.0)
MCHC: 32.1 g/dL (ref 30.0–36.0)
MCV: 88.6 fL (ref 80.0–100.0)
Platelets: 232 10*3/uL (ref 150–400)
RBC: 3.94 MIL/uL (ref 3.87–5.11)
RDW: 15.1 % (ref 11.5–15.5)
WBC: 8 10*3/uL (ref 4.0–10.5)
nRBC: 0 % (ref 0.0–0.2)

## 2019-05-04 LAB — POCT PREGNANCY, URINE: Preg Test, Ur: NEGATIVE
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
## 2019-05-05 ENCOUNTER — Encounter: Payer: Self-pay | Admitting: Emergency Medicine

## 2019-05-05 ENCOUNTER — Emergency Department
Admission: EM | Admit: 2019-05-05 | Discharge: 2019-05-05 | Disposition: A | Discharge status: Home / Self Care | Payer: Medicaid Other | Attending: Emergency Medicine | Admitting: Emergency Medicine

## 2019-05-05 DIAGNOSIS — F1410 Cocaine abuse, uncomplicated: Secondary | ICD-10-CM

## 2019-05-05 DIAGNOSIS — F1990 Other psychoactive substance use, unspecified, uncomplicated: Secondary | ICD-10-CM
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
## 2019-05-05 NOTE — Discharge Instructions (Signed)
You have been seen in the Emergency Department (ED)  today for a psychiatric complaint.  You have been evaluated by psychiatry and we believe you are safe to be discharged from the hospital.   ° °Please return to the Emergency Department (ED)  immediately if you have ANY thoughts of hurting yourself or anyone else, so that we may help you. ° °Please avoid alcohol and drug use. ° °Follow up with your doctor and/or therapist as soon as possible regarding today's ED  visit.  ° °You may call crisis hotline for [place] at [phone]. ° °

## 2019-07-07 ENCOUNTER — Encounter: Payer: Self-pay | Admitting: Emergency Medicine

## 2019-07-07 ENCOUNTER — Other Ambulatory Visit: Payer: Self-pay

## 2019-07-07 ENCOUNTER — Ambulatory Visit
Admission: EM | Admit: 2019-07-07 | Discharge: 2019-07-07 | Disposition: A | Discharge status: Home / Self Care | Payer: Medicaid Other | Attending: Family Medicine | Admitting: Family Medicine

## 2019-07-07 DIAGNOSIS — F419 Anxiety disorder, unspecified: Secondary | ICD-10-CM | POA: Diagnosis not present

## 2019-07-07 DIAGNOSIS — Z765 Malingerer [conscious simulation]: Secondary | ICD-10-CM

## 2019-07-07 DIAGNOSIS — R110 Nausea: Secondary | ICD-10-CM

## 2019-07-07 HISTORY — DX: Cocaine abuse, in remission: F14.11

## 2019-07-07 HISTORY — DX: Other psychoactive substance use, unspecified, uncomplicated: F19.90

## 2019-07-07 MED ORDER — ONDANSETRON HCL 4 MG PO TABS: 4.0000 mg | ORAL_TABLET | Freq: Four times a day (QID) | ORAL | 0 refills | Status: DC

## 2019-07-07 MED ORDER — HYDROXYZINE HCL 25 MG PO TABS: 25.0000 mg | ORAL_TABLET | Freq: Three times a day (TID) | ORAL | 0 refills | Status: AC | PRN
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
## 2019-07-07 NOTE — ED Triage Notes (Signed)
Patient states that she is going through some stress at home and having to go to court over her daughter.  Patient states that she has been out Klonopin for two weeks.  Patient states that her anxiety is worse and is having panic attacks.
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
## 2019-07-08 ENCOUNTER — Encounter: Payer: Self-pay | Admitting: Urgent Care

## 2019-12-19 ENCOUNTER — Other Ambulatory Visit (HOSPITAL_COMMUNITY): Payer: Self-pay | Admitting: Psychiatric/Mental Health

## 2021-03-18 ENCOUNTER — Ambulatory Visit: Payer: Self-pay

## 2021-10-21 ENCOUNTER — Ambulatory Visit
Admission: EM | Admit: 2021-10-21 | Discharge: 2021-10-21 | Disposition: A | Discharge status: Home / Self Care | Payer: Medicaid Other | Attending: Family Medicine | Admitting: Family Medicine

## 2021-10-21 ENCOUNTER — Encounter: Payer: Self-pay | Admitting: Emergency Medicine

## 2021-10-21 ENCOUNTER — Other Ambulatory Visit: Payer: Self-pay

## 2021-10-21 DIAGNOSIS — M545 Low back pain, unspecified: Secondary | ICD-10-CM

## 2021-10-21 DIAGNOSIS — L0291 Cutaneous abscess, unspecified: Secondary | ICD-10-CM | POA: Diagnosis not present

## 2021-10-21 MED ORDER — METHOCARBAMOL 500 MG PO TABS: 500.0000 mg | ORAL_TABLET | Freq: Two times a day (BID) | ORAL | 0 refills | Status: AC

## 2021-10-21 MED ORDER — SULFAMETHOXAZOLE-TRIMETHOPRIM 800-160 MG PO TABS: 1.0000 | ORAL_TABLET | Freq: Two times a day (BID) | ORAL | 0 refills | Status: AC

## 2021-10-21 NOTE — ED Provider Notes (Signed)
[facility]    CSN: [id number] Arrival date & time: 10/21/21  1911      History   Chief Complaint Chief Complaint  Patient presents with   Abscess    HPI Virginia Lawson is a 31 y.o. female.   HPI  Noticed it 2 days ago and   Recently moved into a new house.   Last night tried to pop it and bloody-dark pus came out. Small amout  *** Fever : no  Chills: no Sore throat: no   Cough: no Sputum: no Nasal congestion : no  Appetite: normal  Hydration: normal  Abdominal pain: no Nausea: no Vomiting: no Dysuria: no  Sleep disturbance: no Back Pain: no Headache: no   Past Medical History:  Diagnosis Date   Anxiety    Bipolar 1 disorder (HCC)    History of cocaine abuse (HCC)    IVDU (intravenous drug user)    PTSD (post-traumatic stress disorder)     There are no problems to display for this patient.   Past Surgical History:  Procedure Laterality Date   HERNIA REPAIR     OVARIAN CYST REMOVAL      OB History   No obstetric history on file.      Home Medications    Prior to Admission medications   Medication Sig Start Date End Date Taking? Authorizing Provider  etonogestrel (NEXPLANON) 68 MG IMPL implant 1 each by Subdermal route once.   Yes [provider]  PARoxetine (PAXIL) 40 MG tablet Take 1 tablet (40 mg total) by mouth every morning. 05/15/18  Yes [name], MD  atorvastatin (LIPITOR) 20 MG tablet Take 20 mg by mouth daily.    [provider]  buprenorphine-naloxone (SUBOXONE) 8-2 mg SUBL SL tablet Place 1 tablet under the tongue daily.    [provider]  clonazePAM (KLONOPIN) 2 MG tablet Take 1 tablet (2 mg total) by mouth daily. Patient taking differently: Take 2 mg by mouth 3 (three) times daily as needed. 05/15/18   [name], MD  desvenlafaxine (PRISTIQ) 50 MG 24 hr tablet Take 50 mg by mouth daily.    [provider]  gabapentin (NEURONTIN) 300 MG capsule Take 600 mg by mouth 3 (three)  times daily.    [provider]  hydrOXYzine (ATARAX/VISTARIL) 25 MG tablet Take 1 tablet (25 mg total) by mouth every 8 (eight) hours as needed. 07/07/19   Verlee Monte, NP  lurasidone (LATUDA) 40 MG TABS tablet Take 1 tablet (40 mg total) by mouth daily with breakfast. 05/15/18   [name], MD  metFORMIN (GLUCOPHAGE) 500 MG tablet Take 500 mg by mouth 2 (two) times daily with a meal.    [provider]  ondansetron (ZOFRAN) 4 MG tablet Take 1 tablet (4 mg total) by mouth every 6 (six) hours. 07/07/19   Verlee Monte, NP  prazosin (MINIPRESS) 1 MG capsule  07/08/17 07/07/19  [provider]    Family History Family History  Family history unknown: Yes    Social History Social History   Tobacco Use   Smoking status: Former    Packs/day: 1.00    Types: Cigarettes   Smokeless tobacco: Never  Vaping Use   Vaping Use: Every day  Substance Use Topics   Alcohol use: No   Drug use: No     Allergies   Doxycycline and Tramadol   Review of Systems Review of Systems : :negative unless otherwise stated in HPI.  Physical Exam Triage Vital Signs ED Triage Vitals  Enc Vitals Group     BP 10/21/21 1942 107/69     Pulse Rate 10/21/21 1942 71     Resp 10/21/21 1942 14     Temp 10/21/21 1942 98 F (36.7 C)     Temp Source 10/21/21 1942 Oral     SpO2 10/21/21 1942 93 %     Weight 10/21/21 1940 175 lb 0.7 oz (79.4 kg)     Height 10/21/21 1940 5\' 6"  (1.676 m)     Head Circumference --      Peak Flow --      Pain Score 10/21/21 1939 5     Pain Loc --      Pain Edu? --      Excl. in GC? --    No data found.  Updated Vital Signs BP 107/69 (BP Location: Right Arm)   Pulse 71   Temp 98 F (36.7 C) (Oral)   Resp 14   Ht 5\' 6"  (1.676 m)   Wt 79.4 kg   SpO2 93%   BMI 28.25 kg/m   Visual Acuity Right Eye Distance:   Left Eye Distance:   Bilateral Distance:    Right Eye Near:   Left Eye Near:    Bilateral Near:     Physical Exam  GEN:  pleasant well appearing fe***female, in no acute distress *** CV: regular rate and rhythm, no murmurs appreciated *** RESP: no increased work of breathing, clear to ascultation bilaterally ABD: Bowel sounds present. Soft, non-tender, non-distended. *** MSK: no edema, or calf tenderness SKIN: warm, dry, no rash on visible skin NEURO: alert, moves all extremities appropriately PSYCH: Normal affect, appropriate speech and behavior   UC Treatments / Results  Labs (all labs ordered are listed, but only abnormal results are displayed) Labs Reviewed - No data to display  EKG   Radiology No results found.  Procedures Procedures (including critical care time)  Medications Ordered in UC Medications - No data to display  Initial Impression / Assessment and Plan / UC Course  I have reviewed the triage vital signs and the nursing notes.  Pertinent labs & imaging results that were available during my care of the patient were reviewed by me and considered in my medical decision making (see chart for details).     *** Final Clinical Impressions(s) / UC Diagnoses   Final diagnoses:  None   Discharge Instructions   None    ED Prescriptions   None    PDMP not reviewed this encounter.

## 2021-10-21 NOTE — ED Triage Notes (Signed)
Pt has abscess on the right lower abdomen. Started about 3 days ago. She states she squeezed it and got blood out of it and afterwards it got much bigger.

## 2021-10-21 NOTE — Discharge Instructions (Addendum)
Stop by the pharmacy to pick up your prescriptions.  If the redness starts to cross over the line please go to the emergency department.  Do not drive while taking Robaxin.  Go to ED for red flag symptoms, including; fevers you cannot reduce with Tylenol/Motrin, severe headaches, vision changes, numbness/weakness in part of the body, lethargy, confusion, intractable vomiting, severe dehydration, chest pain, breathing difficulty, severe persistent abdominal or pelvic pain, signs of severe infection (increased redness, swelling of an area), feeling faint or passing out, dizziness, etc. You should especially go to the ED for sudden acute worsening of condition if you do not elect to go at this time.

## 2021-10-31 ENCOUNTER — Ambulatory Visit
Admission: EM | Admit: 2021-10-31 | Discharge: 2021-10-31 | Discharge status: Absent without leave | Payer: Medicaid Other

## 2021-12-15 ENCOUNTER — Other Ambulatory Visit: Payer: Self-pay

## 2021-12-15 DIAGNOSIS — F1123 Opioid dependence with withdrawal: Secondary | ICD-10-CM | POA: Insufficient documentation

## 2021-12-15 DIAGNOSIS — F1920 Other psychoactive substance dependence, uncomplicated: Secondary | ICD-10-CM | POA: Insufficient documentation

## 2021-12-15 LAB — COMPREHENSIVE METABOLIC PANEL
ALT: 9 U/L (ref 0–44)
AST: 19 U/L (ref 15–41)
Albumin: 3.9 g/dL (ref 3.5–5.0)
Alkaline Phosphatase: 93 U/L (ref 38–126)
Anion gap: 11 (ref 5–15)
BUN: 10 mg/dL (ref 6–20)
CO2: 25 mmol/L (ref 22–32)
Calcium: 9.4 mg/dL (ref 8.9–10.3)
Chloride: 98 mmol/L (ref 98–111)
Creatinine, Ser: 0.82 mg/dL (ref 0.44–1.00)
GFR, Estimated: >60 mL/min (ref >60)
Glucose, Bld: 207 mg/dL — ABNORMAL HIGH (ref 70–99)
Potassium: 3.6 mmol/L (ref 3.5–5.1)
Sodium: 134 mmol/L — ABNORMAL LOW (ref 135–145)
Total Bilirubin: 0.6 mg/dL (ref 0.3–1.2)
Total Protein: 7.9 g/dL (ref 6.5–8.1)

## 2021-12-15 LAB — CBC
HCT: 40.6 % (ref 36.0–46.0)
Hemoglobin: 12.9 g/dL (ref 12.0–15.0)
MCH: 28.6 pg (ref 26.0–34.0)
MCHC: 31.8 g/dL (ref 30.0–36.0)
MCV: 90 fL (ref 80.0–100.0)
Platelets: 215 10*3/uL (ref 150–400)
RBC: 4.51 MIL/uL (ref 3.87–5.11)
RDW: 13.3 % (ref 11.5–15.5)
WBC: 17.6 10*3/uL — ABNORMAL HIGH (ref 4.0–10.5)
nRBC: 0 % (ref 0.0–0.2)

## 2021-12-15 LAB — LIPASE, BLOOD: Lipase: 26 U/L (ref 11–51)

## 2021-12-15 NOTE — ED Provider Triage Note (Signed)
Emergency Medicine Provider Triage Evaluation Note  Avary Eichenberger , a 31 y.o. female  was evaluated in triage.  Pt complains of of withdrawal from Suboxone and fentanyl.  Patient reports onset of symptoms earlier today.  She reports she last used this morning.  She endorses some nausea and vomiting as well as some abdominal discomfort.  Review of Systems  Positive: Substance abuse withdrawal, NV Negative: CP  Physical Exam  BP 106/62   Pulse 89   Temp 98.3 F (36.8 C) (Oral)   Resp 20   Wt 79.4 kg   SpO2 98%   BMI 28.25 kg/m  Gen:   Awake, no distress  Resp:  Normal effort CTA MSK:   Moves extremities without difficulty  Other:    Medical Decision Making  Medically screening exam initiated at 10:18 PM.  Appropriate orders placed.  Robby Ante was informed that the remainder of the evaluation will be completed by another provider, this initial triage assessment does not replace that evaluation, and the importance of remaining in the ED until their evaluation is complete.  Patient to the ED for evaluation and treatment of withdrawal from Suboxone and fentanyl.   Melvenia Needles, PA-C 12/15/21 2229

## 2021-12-15 NOTE — ED Triage Notes (Addendum)
Pt arrives with c/o withdrawal from suboxone/fentanyl that started earlier today. Per pt, the last she used was this morning. Pt endorses n/v and ABD pain.

## 2021-12-16 ENCOUNTER — Emergency Department
Admission: EM | Admit: 2021-12-16 | Discharge: 2021-12-16 | Disposition: A | Discharge status: Home / Self Care | Payer: Medicaid Other | Attending: Emergency Medicine | Admitting: Emergency Medicine

## 2021-12-16 DIAGNOSIS — R112 Nausea with vomiting, unspecified: Secondary | ICD-10-CM

## 2021-12-16 DIAGNOSIS — F1193 Opioid use, unspecified with withdrawal: Secondary | ICD-10-CM

## 2021-12-16 LAB — HCG, QUANTITATIVE, PREGNANCY: hCG, Beta Chain, Quant, S: 4 m[IU]/mL (ref <5)

## 2021-12-16 MED ORDER — ONDANSETRON 4 MG PO TBDP: 4.0000 mg | ORAL_TABLET | Freq: Three times a day (TID) | ORAL | 0 refills | Status: DC | PRN

## 2021-12-16 MED ORDER — LACTATED RINGERS IV BOLUS
1000.0000 mL | Freq: Once | INTRAVENOUS | Status: AC
  Administered 2021-12-16: 1000 mL via INTRAVENOUS

## 2021-12-16 MED ORDER — LOPERAMIDE HCL 2 MG PO CAPS
2.0000 mg | ORAL_CAPSULE | Freq: Once | ORAL | Status: AC
  Administered 2021-12-16: 2 mg via ORAL
  Filled 2021-12-16: qty 1

## 2021-12-16 MED ORDER — ONDANSETRON HCL 4 MG/2ML IJ SOLN
4.0000 mg | Freq: Once | INTRAMUSCULAR | Status: AC
  Administered 2021-12-16: 4 mg via INTRAVENOUS
  Filled 2021-12-16: qty 2

## 2021-12-16 MED ORDER — KETOROLAC TROMETHAMINE 30 MG/ML IJ SOLN
15.0000 mg | Freq: Once | INTRAMUSCULAR | Status: AC
  Administered 2021-12-16: 15 mg via INTRAVENOUS
  Filled 2021-12-16: qty 1

## 2021-12-16 NOTE — ED Notes (Signed)
Pt refusing to wear monitoring cords.

## 2021-12-16 NOTE — ED Notes (Signed)
Pt refused discharge vitals 

## 2021-12-16 NOTE — ED Provider Notes (Signed)
[facility] Provider Note    Event Date/Time   First MD Initiated Contact with Patient 12/16/21 0302     (approximate)   History   Chief Complaint Withdrawal   HPI  Takela Varden is a 31 y.o. female with past medical history of IV drug use, PTSD, bipolar disorder, and anxiety who presents to the ED complaining of withdrawal.  Patient reports that she relapsed on fentanyl about 1 week ago, then took her usual Suboxone a couple of days ago.  She reports worsening symptoms of withdrawal since then with nausea, vomiting, diarrhea, malaise, and weakness.  She has had difficulty keeping down either liquids or solids, reports associated abdominal pain.  She denies any fevers, cough, chest pain, shortness of breath, or dysuria.  She admits to snorting fentanyl but denies any recent IV drug use.     Physical Exam   Triage Vital Signs: ED Triage Vitals  Enc Vitals Group     BP 12/15/21 2145 106/62     Pulse Rate 12/15/21 2145 89     Resp 12/15/21 2145 20     Temp 12/15/21 2145 98.3 F (36.8 C)     Temp Source 12/15/21 2145 Oral     SpO2 12/15/21 2145 98 %     Weight 12/15/21 2143 175 lb (79.4 kg)     Height --      Head Circumference --      Peak Flow --      Pain Score 12/15/21 2143 10     Pain Loc --      Pain Edu? --      Excl. in GC? --     Most recent vital signs: Vitals:   12/15/21 2145 12/16/21 0145  BP: 106/62 (!) 97/57  Pulse: 89 79  Resp: 20 17  Temp: 98.3 F (36.8 C) 97.8 F (36.6 C)  SpO2: 98% 95%    Constitutional: Alert and oriented. Eyes: Conjunctivae are normal. Head: Atraumatic. Nose: No congestion/rhinnorhea. Mouth/Throat: Mucous membranes are moist.  Cardiovascular: Normal rate, regular rhythm. Grossly normal heart sounds.  2+ radial pulses bilaterally. Respiratory: Normal respiratory effort.  No retractions. Lungs CTAB. Gastrointestinal: Soft and nontender. No distention. Musculoskeletal: No lower extremity tenderness nor  edema.  Neurologic:  Normal speech and language. No gross focal neurologic deficits are appreciated.    ED Results / Procedures / Treatments   Labs (all labs ordered are listed, but only abnormal results are displayed) Labs Reviewed  COMPREHENSIVE METABOLIC PANEL - Abnormal; Notable for the following components:      Result Value   Sodium 134 (*)    Glucose, Bld 207 (*)    All other components within normal limits  CBC - Abnormal; Notable for the following components:   WBC 17.6 (*)    All other components within normal limits  LIPASE, BLOOD  HCG, QUANTITATIVE, PREGNANCY  URINALYSIS, ROUTINE W REFLEX MICROSCOPIC  POC URINE PREG, ED    PROCEDURES:  Critical Care performed: No  Procedures   MEDICATIONS ORDERED IN ED: Medications  lactated ringers bolus 1,000 mL (1,000 mLs Intravenous New Bag/Given 12/16/21 0330)  ondansetron (ZOFRAN) injection 4 mg (4 mg Intravenous Given 12/16/21 0330)  ketorolac (TORADOL) 30 MG/ML injection 15 mg (15 mg Intravenous Given 12/16/21 0408)     IMPRESSION / MDM / ASSESSMENT AND PLAN / ED COURSE  I reviewed the triage vital signs and the nursing notes.  31 y.o. female with past medical history of IV drug use, PTSD, bipolar disorder, and anxiety who presents to the ED complaining of withdrawal symptoms including nausea, vomiting, and diarrhea after she took her usual Suboxone after relapsing on fentanyl.  Patient's presentation is most consistent with acute presentation with potential threat to life or bodily function.  Differential diagnosis includes, but is not limited to, opiate withdrawal, dehydration, electrolyte abnormality, AKI, pregnancy, UTI.   Patient uncomfortable appearing but in no acute distress, vital signs are unremarkable.  She reports frequent vomiting recently and symptoms are consistent with opiate withdrawal induced by patient taking Suboxone after recent opiate use.  Labs remarkable for  leukocytosis but doubt sepsis given reassuring vital signs and no apparent source of infection.  No significant anemia, electrolyte abnormality, or AKI noted, LFTs are unremarkable.  We will treat symptomatically with IV fluids and Zofran, pregnancy testing and urinalysis are pending.  Pregnancy testing is negative, patient thus far unable to provide urine sample but doubt UTI given no urinary symptoms.  She does report feeling better following IV fluids and Zofran, was also given IV Toradol for pain with improvement.  She is appropriate for discharge home and will be prescribed Zofran, was counseled to follow-up with her PCP and avoid opiates other than her prescribed Suboxone.  She was counseled to return to the ED for new or worsening symptoms, patient agrees with plan.      FINAL CLINICAL IMPRESSION(S) / ED DIAGNOSES   Final diagnoses:  Opiate withdrawal (HCC)  Buprenorphine and naloxone withdrawal (HCC)  Nausea and vomiting, unspecified vomiting type     Rx / DC Orders   ED Discharge Orders          Ordered    ondansetron (ZOFRAN-ODT) 4 MG disintegrating tablet  Every 8 hours PRN        12/16/21 0438             Note:  This document was prepared using Dragon voice recognition software and may include unintentional dictation errors.   Blake Divine, MD 12/16/21 [phone]

## 2021-12-16 NOTE — ED Notes (Addendum)
Dr. Charna Archer to room to answer pt's questions.  No further questions at this time.  Pt calling [employer].

## 2021-12-18 ENCOUNTER — Ambulatory Visit
Admission: EM | Admit: 2021-12-18 | Discharge: 2021-12-18 | Disposition: A | Discharge status: Home / Self Care | Payer: Medicaid Other | Attending: Emergency Medicine | Admitting: Emergency Medicine

## 2021-12-18 ENCOUNTER — Ambulatory Visit (INDEPENDENT_AMBULATORY_CARE_PROVIDER_SITE_OTHER): Payer: Self-pay

## 2021-12-18 DIAGNOSIS — R319 Hematuria, unspecified: Secondary | ICD-10-CM

## 2021-12-18 DIAGNOSIS — T7421XA Adult sexual abuse, confirmed, initial encounter: Secondary | ICD-10-CM | POA: Insufficient documentation

## 2021-12-18 DIAGNOSIS — R1010 Upper abdominal pain, unspecified: Secondary | ICD-10-CM | POA: Insufficient documentation

## 2021-12-18 DIAGNOSIS — R1011 Right upper quadrant pain: Secondary | ICD-10-CM

## 2021-12-18 DIAGNOSIS — K529 Noninfective gastroenteritis and colitis, unspecified: Secondary | ICD-10-CM | POA: Insufficient documentation

## 2021-12-18 DIAGNOSIS — R300 Dysuria: Secondary | ICD-10-CM

## 2021-12-18 DIAGNOSIS — R350 Frequency of micturition: Secondary | ICD-10-CM

## 2021-12-18 DIAGNOSIS — N760 Acute vaginitis: Secondary | ICD-10-CM | POA: Insufficient documentation

## 2021-12-18 DIAGNOSIS — B9689 Other specified bacterial agents as the cause of diseases classified elsewhere: Secondary | ICD-10-CM | POA: Insufficient documentation

## 2021-12-18 DIAGNOSIS — N10 Acute tubulo-interstitial nephritis: Secondary | ICD-10-CM | POA: Insufficient documentation

## 2021-12-18 DIAGNOSIS — Z113 Encounter for screening for infections with a predominantly sexual mode of transmission: Secondary | ICD-10-CM | POA: Insufficient documentation

## 2021-12-18 LAB — COMPREHENSIVE METABOLIC PANEL
ALT: 29 U/L (ref 0–44)
AST: 65 U/L — ABNORMAL HIGH (ref 15–41)
Albumin: 3.1 g/dL — ABNORMAL LOW (ref 3.5–5.0)
Alkaline Phosphatase: 79 U/L (ref 38–126)
Anion gap: 7 (ref 5–15)
BUN: 11 mg/dL (ref 6–20)
CO2: 26 mmol/L (ref 22–32)
Calcium: 8.5 mg/dL — ABNORMAL LOW (ref 8.9–10.3)
Chloride: 101 mmol/L (ref 98–111)
Creatinine, Ser: 0.83 mg/dL (ref 0.44–1.00)
GFR, Estimated: >60 mL/min (ref >60)
Glucose, Bld: 129 mg/dL — ABNORMAL HIGH (ref 70–99)
Potassium: 3.4 mmol/L — ABNORMAL LOW (ref 3.5–5.1)
Sodium: 134 mmol/L — ABNORMAL LOW (ref 135–145)
Total Bilirubin: 0.4 mg/dL (ref 0.3–1.2)
Total Protein: 7.4 g/dL (ref 6.5–8.1)

## 2021-12-18 LAB — CBC WITH DIFFERENTIAL/PLATELET
Abs Immature Granulocytes: 0.08 10*3/uL — ABNORMAL HIGH (ref 0.00–0.07)
Basophils Absolute: 0.1 10*3/uL (ref 0.0–0.1)
Basophils Relative: 0 %
Eosinophils Absolute: 0 10*3/uL (ref 0.0–0.5)
Eosinophils Relative: 0 %
HCT: 37.3 % (ref 36.0–46.0)
Hemoglobin: 11.9 g/dL — ABNORMAL LOW (ref 12.0–15.0)
Immature Granulocytes: 1 %
Lymphocytes Relative: 12 %
Lymphs Abs: 1.4 10*3/uL (ref 0.7–4.0)
MCH: 29.1 pg (ref 26.0–34.0)
MCHC: 31.9 g/dL (ref 30.0–36.0)
MCV: 91.2 fL (ref 80.0–100.0)
Monocytes Absolute: 1.1 10*3/uL — ABNORMAL HIGH (ref 0.1–1.0)
Monocytes Relative: 10 %
Neutro Abs: 8.7 10*3/uL — ABNORMAL HIGH (ref 1.7–7.7)
Neutrophils Relative %: 77 %
Platelets: 255 10*3/uL (ref 150–400)
RBC: 4.09 MIL/uL (ref 3.87–5.11)
RDW: 13.5 % (ref 11.5–15.5)
WBC: 11.2 10*3/uL — ABNORMAL HIGH (ref 4.0–10.5)
nRBC: 0 % (ref 0.0–0.2)

## 2021-12-18 LAB — WET PREP, GENITAL
Sperm: NONE SEEN
Trich, Wet Prep: NONE SEEN
WBC, Wet Prep HPF POC: <10 — AB (ref <10)
Yeast Wet Prep HPF POC: NONE SEEN

## 2021-12-18 LAB — URINALYSIS, MICROSCOPIC (REFLEX)

## 2021-12-18 LAB — URINALYSIS, ROUTINE W REFLEX MICROSCOPIC
Glucose, UA: 100 mg/dL — AB
Ketones, ur: 15 mg/dL — AB
Leukocytes,Ua: NEGATIVE
Nitrite: POSITIVE — AB
Protein, ur: >300 mg/dL — AB
Specific Gravity, Urine: 1.02 (ref 1.005–1.030)
pH: 5.5 (ref 5.0–8.0)

## 2021-12-18 LAB — HIV ANTIBODY (ROUTINE TESTING W REFLEX): HIV Screen 4th Generation wRfx: NONREACTIVE

## 2021-12-18 LAB — LIPASE, BLOOD: Lipase: 17 U/L (ref 11–51)

## 2021-12-18 MED ORDER — METRONIDAZOLE 500 MG PO TABS: 500.0000 mg | ORAL_TABLET | Freq: Three times a day (TID) | ORAL | 0 refills | Status: AC

## 2021-12-18 MED ORDER — ONDANSETRON 8 MG PO TBDP
8.0000 mg | ORAL_TABLET | Freq: Once | ORAL | Status: AC
  Administered 2021-12-18: 8 mg via ORAL

## 2021-12-18 MED ORDER — KETOROLAC TROMETHAMINE 30 MG/ML IJ SOLN
30.0000 mg | Freq: Once | INTRAMUSCULAR | Status: AC
  Administered 2021-12-18: 30 mg via INTRAVENOUS

## 2021-12-18 MED ORDER — SODIUM CHLORIDE 0.9 % IV BOLUS
1000.0000 mL | Freq: Once | INTRAVENOUS | Status: AC
  Administered 2021-12-18: 1000 mL via INTRAVENOUS

## 2021-12-18 MED ORDER — ONDANSETRON 8 MG PO TBDP: 8.0000 mg | ORAL_TABLET | Freq: Three times a day (TID) | ORAL | 0 refills | Status: AC | PRN

## 2021-12-18 MED ORDER — CEFTRIAXONE SODIUM 1 G IJ SOLR
1.0000 g | Freq: Once | INTRAMUSCULAR | Status: AC
  Administered 2021-12-18: 1 g via INTRAMUSCULAR

## 2021-12-18 MED ORDER — KETOROLAC TROMETHAMINE 10 MG PO TABS: 10.0000 mg | ORAL_TABLET | Freq: Four times a day (QID) | ORAL | 0 refills | Status: AC | PRN

## 2021-12-18 MED ORDER — CIPROFLOXACIN HCL 500 MG PO TABS: 500.0000 mg | ORAL_TABLET | Freq: Two times a day (BID) | ORAL | 0 refills | Status: AC
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
## 2021-12-18 NOTE — ED Triage Notes (Signed)
Patient reports that she is having right sided abdominal pain. Patient repots that it hurts to sit, stand, and move -- started about 3 days ago.    Patient reports that a few days ago she had to go to the ED for vomiting and diarrhea.
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
## 2021-12-19 LAB — RPR: RPR Ser Ql: NONREACTIVE

## 2021-12-20 LAB — URINE CULTURE

## 2021-12-21 LAB — CERVICOVAGINAL ANCILLARY ONLY
Chlamydia: NEGATIVE
Comment: NEGATIVE
Comment: NORMAL
Neisseria Gonorrhea: POSITIVE — AB
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
## 2022-06-20 ENCOUNTER — Encounter: Payer: Self-pay | Admitting: Emergency Medicine

## 2022-06-20 ENCOUNTER — Ambulatory Visit
Admission: EM | Admit: 2022-06-20 | Discharge: 2022-06-20 | Disposition: A | Discharge status: Home / Self Care | Payer: Medicaid Other | Attending: Family Medicine | Admitting: Family Medicine

## 2022-06-20 DIAGNOSIS — W19XXXA Unspecified fall, initial encounter: Secondary | ICD-10-CM

## 2022-06-20 DIAGNOSIS — Y92009 Unspecified place in unspecified non-institutional (private) residence as the place of occurrence of the external cause: Secondary | ICD-10-CM

## 2022-06-20 DIAGNOSIS — M542 Cervicalgia: Secondary | ICD-10-CM

## 2022-06-20 DIAGNOSIS — S060X1A Concussion with loss of consciousness of 30 minutes or less, initial encounter: Secondary | ICD-10-CM | POA: Diagnosis not present

## 2022-06-20 DIAGNOSIS — S0990XA Unspecified injury of head, initial encounter: Secondary | ICD-10-CM

## 2022-06-20 NOTE — Discharge Instructions (Signed)

## 2022-06-20 NOTE — ED Triage Notes (Addendum)
Patient states that she fell off her bed last night and hit the back of her head and neck on the corner of the dresser.  Patient denies LOC.  Patient states that she had loss of vision and ringing in her ear right after her fall.  Patient states that the loss of vision only lasted a second.  Patient reports N/V that started after the fall.  Patient reports ongoing Neck and head pain.  Patient states that she needs a work note.

## 2022-06-20 NOTE — ED Provider Notes (Addendum)
[facility]    CSN: [id number] Arrival date & time: 06/20/22  1313      History   Chief Complaint Chief Complaint  Patient presents with   Fall   Head Injury    HPI Virginia Lawson is a 32 y.o. female.   HPI   Virginia Lawson presents for fall at home yesterday.  Reports that her bed is very high.  She fell last night off of bed. She was "knocked out for a second."  She had vomiting and diarrhea with blurry vision. Had loud ringing in her ears. She has pain in her neck and head.  Her mom wanted her to come in to be seen.  She continues to have nausea, intermittent blurry vision, neck pain and back pain since falling off the bed.      Past Medical History:  Diagnosis Date   Anxiety    Bipolar 1 disorder    History of cocaine abuse    IVDU (intravenous drug user)    PTSD (post-traumatic stress disorder)     There are no problems to display for this patient.   Past Surgical History:  Procedure Laterality Date   HERNIA REPAIR     OVARIAN CYST REMOVAL      OB History   No obstetric history on file.      Home Medications    Prior to Admission medications   Medication Sig Start Date End Date Taking? Authorizing Provider  FLUoxetine (PROZAC) 20 MG capsule Take 60 mg by mouth daily. 06/11/22  Yes [provider]  OLANZapine (ZYPREXA) 15 MG tablet Take 15 mg by mouth daily. 06/11/22  Yes [provider]  atorvastatin (LIPITOR) 20 MG tablet Take 20 mg by mouth daily.    [provider]  buprenorphine-naloxone (SUBOXONE) 8-2 mg SUBL SL tablet Place 1 tablet under the tongue daily.    [provider]  clonazePAM (KLONOPIN) 2 MG tablet Take 1 tablet (2 mg total) by mouth daily. Patient taking differently: Take 2 mg by mouth 3 (three) times daily as needed. 05/15/18   [name], MD  desvenlafaxine (PRISTIQ) 50 MG 24 hr tablet Take 50 mg by mouth daily.    [provider]  etonogestrel (NEXPLANON) 68 MG IMPL implant 1 each by  Subdermal route once.    [provider]  gabapentin (NEURONTIN) 300 MG capsule Take 600 mg by mouth 3 (three) times daily.    [provider]  hydrOXYzine (ATARAX/VISTARIL) 25 MG tablet Take 1 tablet (25 mg total) by mouth every 8 (eight) hours as needed. 07/07/19   Verlee Monte, NP  ketorolac (TORADOL) 10 MG tablet Take 1 tablet (10 mg total) by mouth every 6 (six) hours as needed. 12/18/21   Becky Augusta, NP  lurasidone (LATUDA) 40 MG TABS tablet Take 1 tablet (40 mg total) by mouth daily with breakfast. 05/15/18   [name], MD  metFORMIN (GLUCOPHAGE) 500 MG tablet Take 500 mg by mouth 2 (two) times daily with a meal.    [provider]  methocarbamol (ROBAXIN) 500 MG tablet Take 1 tablet (500 mg total) by mouth 2 (two) times daily. 10/21/21   Arlander Gillen, Seward Meth, DO  ondansetron (ZOFRAN-ODT) 8 MG disintegrating tablet Take 1 tablet (8 mg total) by mouth every 8 (eight) hours as needed for nausea or vomiting. 12/18/21   Becky Augusta, NP  PARoxetine (PAXIL) 40 MG tablet Take 1 tablet (40 mg total) by mouth every morning. 05/15/18   [name], MD  prazosin (MINIPRESS) 1 MG capsule  07/08/17 07/07/19  [provider]    Family History Family History  Family history unknown: Yes    Social History Social History   Tobacco Use   Smoking status: Former    Packs/day: 1    Types: Cigarettes   Smokeless tobacco: Never  Vaping Use   Vaping Use: Every day  Substance Use Topics   Alcohol use: No   Drug use: Yes    Types: IV     Allergies   Doxycycline and Tramadol   Review of Systems Review of Systems: negative unless otherwise stated in HPI.      Physical Exam Triage Vital Signs ED Triage Vitals  Enc Vitals Group     BP 06/20/22 1342 (!) 113/57     Pulse Rate 06/20/22 1342 68     Resp 06/20/22 1342 14     Temp 06/20/22 1342 98.4 F (36.9 C)     Temp Source 06/20/22 1342 Oral     SpO2 06/20/22 1342 94 %     Weight 06/20/22 1339 170 lb  (77.1 kg)     Height 06/20/22 1339 5\' 7"  (1.702 m)     Head Circumference --      Peak Flow --      Pain Score 06/20/22 1339 7     Pain Loc --      Pain Edu? --      Excl. in GC? --    No data found.  Updated Vital Signs BP (!) 113/57 (BP Location: Right Arm)   Pulse 68   Temp 98.4 F (36.9 C) (Oral)   Resp 14   Ht 5\' 7"  (1.702 m)   Wt 77.1 kg   SpO2 94%   BMI 26.63 kg/m   Visual Acuity Right Eye Distance:   Left Eye Distance:   Bilateral Distance:    Right Eye Near:   Left Eye Near:    Bilateral Near:     Physical Exam GEN: Alert, female in no acute distress  EYES: Extraocular movements intact, pupils equal round and reactive to light HENT: Moist mucous membranes, no oropharyngeal lesions, no blood visble, no hemotympanum,  NECK: Normal range of motion, no cervical spinous tenderness,  no paraspinal tenderness bilaterally CV: regular rate and rhythm, no chest wall trauma RESP: no increased work of breathing, clear to ascultation bilaterally ABD: Bowel sounds present. Soft, non-tender, non-distended.  No palpable masses, no rebound, no guarding, MSK: No extremity edema or deformities, baseline ROM  bilateral shoulder: Normal range of motion, no tenderness to palpation, no scapular tenderness Thoracic and lumbar spine: middle thoracic spinous process tenderness and paraspinal tenderness bilaterally Bilateral hips: Normal range of motion,no iliac crest tenderness, pelvis stable SKIN: warm, dry NEURO: alert, moves all extremities appropriately, strength 5/5 bilateral upper and lower extremities, alert and oriented, normal speech PSYCH: Normal affect, appropriate speech and behavior      UC Treatments / Results  Labs (all labs ordered are listed, but only abnormal results are displayed) Labs Reviewed - No data to display  EKG   Radiology No results found.  Procedures Procedures (including critical care time)  Medications Ordered in UC Medications - No  data to display  Initial Impression / Assessment and Plan / UC Course  I have reviewed the triage vital signs and the nursing notes.  Pertinent labs & imaging results that were available during my care of the patient were reviewed by me and considered in my medical decision  making (see chart for details).      Patient is a 32 y.o. female who presents for head injury that occurred last night after falling off the bed and hitting her head and neck. On chart review, she has a history of polysubstance abuse with acute withdrawal. There was a scent of alcohol. She has intermittent blurry vision, nausea and headache with continues neck pain. Her neuro exam is unremarkable.  I suspect patient has a concussion. I can not rule out an intracranial or cranial bony  injury here. Given her mechanism of injury and persistent symptoms recommended ED evaluation for head imaging. All questions asked were answered.  Pt will travel via private vehicle to the emergency department.   ED and return precautions given and patient voiced understanding. Discussed MDM, treatment plan and plan for follow-up with patient who agrees with plan.      Final Clinical Impressions(s) / UC Diagnoses   Final diagnoses:  Fall in home, initial encounter  Injury of head, initial encounter  Concussion with loss of consciousness of 30 minutes or less, initial encounter     Discharge Instructions      You have been advised to follow up immediately in the emergency department for concerning signs or symptoms as discussed during your visit. If you declined EMS transport, please have a family member take you directly to the ED at this time. Do not delay.   Based on concerns about condition, if you do not follow up in the ED, you may risk poor outcomes including worsening of condition, delayed treatment and potentially life threatening issues. If you have declined to go to the ED at this time, you should call your PCP immediately to  set up a follow up appointment.   Go to ED for red flag symptoms, including; fevers you cannot reduce with Tylenol/Motrin, severe headaches, vision changes, numbness/weakness in part of the body, lethargy, confusion, intractable vomiting, severe dehydration, chest pain, breathing difficulty, severe persistent abdominal or pelvic pain, signs of severe infection (increased redness, swelling of an area), feeling faint or passing out, dizziness, etc. You should especially go to the ED for sudden acute worsening of condition if you do not elect to go at this time.       ED Prescriptions   None    PDMP not reviewed this encounter.      Katha Cabal, DO 06/20/22 1433

## 2022-06-20 NOTE — ED Notes (Signed)
Patient is being discharged from the Urgent Care and sent to the Emergency Department via private vehicle . Per Dr. Rachael Darby, patient is in need of higher level of care due to Head Injury and needs CT scan. Patient is aware and verbalizes understanding of plan of care.  Vitals:   06/20/22 1342  BP: (!) 113/57  Pulse: 68  Resp: 14  Temp: 98.4 F (36.9 C)  SpO2: 94%
